# Patient Record
Sex: Female | Born: 1940 | Race: White | Hispanic: No | Smoking: Former smoker
Health system: Southern US, Community
[De-identification: ages and names within clinical notes are randomized; demographics above are authoritative.]

## PROBLEM LIST (undated history)

## (undated) DIAGNOSIS — E785 Hyperlipidemia, unspecified: Secondary | ICD-10-CM

## (undated) DIAGNOSIS — E079 Disorder of thyroid, unspecified: Secondary | ICD-10-CM

## (undated) DIAGNOSIS — F419 Anxiety disorder, unspecified: Secondary | ICD-10-CM

## (undated) DIAGNOSIS — F0390 Unspecified dementia without behavioral disturbance: Secondary | ICD-10-CM

## (undated) DIAGNOSIS — I10 Essential (primary) hypertension: Secondary | ICD-10-CM

## (undated) HISTORY — DX: Anxiety disorder, unspecified: F41.9

## (undated) HISTORY — PX: EYE SURGERY: SHX253

## (undated) HISTORY — DX: Disorder of thyroid, unspecified: E07.9

## (undated) HISTORY — DX: Hyperlipidemia, unspecified: E78.5

## (undated) HISTORY — DX: Essential (primary) hypertension: I10

## (undated) HISTORY — DX: Unspecified dementia, unspecified severity, without behavioral disturbance, psychotic disturbance, mood disturbance, and anxiety: F03.90

---

## 2013-01-05 ENCOUNTER — Encounter: Payer: Self-pay | Admitting: Physician Assistant

## 2013-01-05 ENCOUNTER — Ambulatory Visit (INDEPENDENT_AMBULATORY_CARE_PROVIDER_SITE_OTHER): Payer: Medicare Other | Admitting: Physician Assistant

## 2013-01-05 VITALS — BP 90/60 | HR 60 | Temp 97.0°F | Resp 16 | Ht 58.25 in | Wt 120.0 lb

## 2013-01-05 DIAGNOSIS — F0390 Unspecified dementia without behavioral disturbance: Secondary | ICD-10-CM

## 2013-01-05 DIAGNOSIS — F411 Generalized anxiety disorder: Secondary | ICD-10-CM

## 2013-01-05 DIAGNOSIS — F419 Anxiety disorder, unspecified: Secondary | ICD-10-CM

## 2013-01-05 DIAGNOSIS — E785 Hyperlipidemia, unspecified: Secondary | ICD-10-CM

## 2013-01-05 DIAGNOSIS — I10 Essential (primary) hypertension: Secondary | ICD-10-CM

## 2013-01-05 DIAGNOSIS — R5383 Other fatigue: Secondary | ICD-10-CM

## 2013-01-05 DIAGNOSIS — R5381 Other malaise: Secondary | ICD-10-CM

## 2013-01-05 DIAGNOSIS — E079 Disorder of thyroid, unspecified: Secondary | ICD-10-CM

## 2013-01-05 LAB — TSH: TSH: 3.471 u[IU]/mL (ref 0.350–4.500)

## 2013-01-05 LAB — CBC WITH DIFFERENTIAL/PLATELET
Basophils Relative: 0 % (ref 0–1)
Eosinophils Absolute: 0 10*3/uL (ref 0.0–0.7)
Eosinophils Relative: 1 % (ref 0–5)
Hemoglobin: 14.1 g/dL (ref 12.0–15.0)
MCH: 31.1 pg (ref 26.0–34.0)
MCHC: 33.3 g/dL (ref 30.0–36.0)
MCV: 93.4 fL (ref 78.0–100.0)
Monocytes Absolute: 0.5 10*3/uL (ref 0.1–1.0)
Monocytes Relative: 10 % (ref 3–12)
Neutrophils Relative %: 51 % (ref 43–77)

## 2013-01-05 LAB — COMPLETE METABOLIC PANEL WITH GFR
Alkaline Phosphatase: 65 U/L (ref 39–117)
BUN: 15 mg/dL (ref 6–23)
CO2: 31 mEq/L (ref 19–32)
Creat: 0.95 mg/dL (ref 0.50–1.10)
GFR, Est African American: 68 mL/min
GFR, Est Non African American: 59 mL/min — ABNORMAL LOW
Glucose, Bld: 105 mg/dL — ABNORMAL HIGH (ref 70–99)
Sodium: 142 mEq/L (ref 135–145)
Total Bilirubin: 0.4 mg/dL (ref 0.3–1.2)

## 2013-01-05 NOTE — Progress Notes (Signed)
Patient ID: Tamara Chavez MRN: [id number], DOB: November 27, 1940, 72 y.o. Date of Encounter: @DATE @  Chief Complaint:  Chief Complaint  Patient presents with  . new pt get est  baseline exam  ? alzheimers  want brain scan    dtr report alot of forgetfulness long/short term  ? HTN  dtr concerned about Namends    HPI: 72 y.o. year old female  presents with her daughter for initial evaluation here. Her daughter, Tamara Chavez, is a patient here. Her mother, Ms. Tamara Chavez, was living in [place], [place] until recently. Pt's husband died one month ago-Now she has moved here to live with her daughter, Tamara Chavez, and her family.  The daughter had not been informed of her mom's medical conditions-pt's husband had taken her to her doctors, etc in [place]. Now pt living with daughter and she doesnot know her mom's medical conditions. Wants to make sure she is on appropriate meds. Doesn't know if she needs a brain scan or what. Says her mom doesn't know her own son's birthdate. Says she cannot leave her in the house alone at all. Says yesterday sent her to mailbox and later found her wandering in back yard. Says she is able to "care for herself"-perform ADLs. "Eats good"- Three meals a day.  Daughter concerned she is not on right meds b/c she has seen no improvement despite her being on Aricept and Namenda.   Past Medical History  Diagnosis Date  . Anxiety   . Dementia   . Hypertension   . Hyperlipidemia   . Thyroid disease     hypothyroid     Home Meds: No current outpatient prescriptions on file prior to visit.   No current facility-administered medications on file prior to visit.    Allergies: No Known Allergies  History   Social History  . Marital Status: Widowed    Spouse Name: N/A    Number of Children: N/A  . Years of Education: N/A   Occupational History  . Not on file.   Social History Main Topics  . Smoking status: Former Smoker -- 0.50 packs/day for 6 years    Quit  date: 08/07/1993  . Smokeless tobacco: Never Used  . Alcohol Use: No  . Drug Use: No  . Sexually Active: Not on file   Other Topics Concern  . Not on file   Social History Narrative  . No narrative on file    Family History  Problem Relation Age of Onset  . Alcohol abuse Mother   . Alcohol abuse Father      Review of Systems: Constitutional: negative for chills, fever, night sweats, weight changes, or fatigue  HEENT: negative for vision changes, hearing loss, congestion, rhinorrhea, ST, epistaxis, or sinus pressure Cardiovascular: negative for chest pain or palpitations. No new/increased shortness of breath or dyspnea on exertion Respiratory: negative for hemoptysis, wheezing, shortness of breath, or cough Abdominal: negative for abdominal pain, nausea, vomiting, diarrhea, or constipation Dermatological: negative for rash or concerning skin lesions Neurologic: negative for headache, dizziness, or syncope All other systems reviewed and are otherwise negative with the exception to those above and in the HPI.   Physical Exam: Blood pressure 90/60, pulse 60, temperature 97 F (36.1 C), temperature source Oral, resp. rate 16, height 4' 10.25" (1.48 m), weight 120 lb (54.432 kg)., Body mass index is 24.85 kg/(m^2). General: Well developed, well nourished,WF. Appears in no acute distress. Head: Normocephalic, atraumatic, eyes without discharge, sclera non-icteric, nares are without discharge. Bilateral auditory canals  clear, TM's are without perforation, pearly grey and translucent with reflective cone of light bilaterally. Oral cavity moist, posterior pharynx without exudate, erythema, peritonsillar abscess, or post nasal drip.  Neck: Supple. No thyromegaly. Full ROM. No lymphadenopathy. Lungs: Clear bilaterally to auscultation without wheezes, rales, or rhonchi. Breathing is unlabored. Heart: RRR with S1 S2. No murmurs, rubs, or gallops. Abdomen: Soft, non-tender, non-distended with  normoactive bowel sounds. No hepatomegaly. No rebound/guarding. No obvious abdominal masses. Musculoskeletal:  Strength and tone normal for age. Extremities/Skin: Warm and dry. No clubbing or cyanosis. No edema. No rashes or suspicious lesions. Neuro: Alert. Moves all extremities spontaneously. Gait is normal. CNII-XII grossly in tact. Psych:  Responds to questions appropriately with a normal affect.     ASSESSMENT AND PLAN:  72 y.o. year old female with  1. Dementia She was seeing a Insurance account manager in [place]. Pt and daughter would like to f/u with a neurologist here. I will refer.  I discussed the natural progression of alzheimers and discussed expectation of Alzheimers meds with pt and duaghter.  Will continue current meds of Namenda and Aricept now. - CBC with Differential - COMPLETE METABOLIC PANEL WITH GFR - Ambulatory referral to Neurology  2. Hypertension At goal. Slightly low today but was normal at LOV with neur -per their OV note. Pt denies any lightheadedness or presyncope. Cont current med. Check lab. - COMPLETE METABOLIC PANEL WITH GFR  3. Thyroid disease On low dose med. Check TSH. - TSH   4. Hyperlipidemia This has been monitored routinely by prior neurologist. Was at goal without medication. Pt not fasting now so will not recheck now.  - COMPLETE METABOLIC PANEL WITH GFR  6. Other malaise and fatigue  7. Screening Mammogram: Last was 04/2012-Negative - CBC with Differential - TSH   Signed, [address] [place], [place], [facility] 01/05/2013 1:14 PM

## 2013-01-12 ENCOUNTER — Ambulatory Visit (INDEPENDENT_AMBULATORY_CARE_PROVIDER_SITE_OTHER): Payer: Medicare Other | Admitting: Physician Assistant

## 2013-01-12 ENCOUNTER — Encounter: Payer: Self-pay | Admitting: Physician Assistant

## 2013-01-12 VITALS — BP 112/80 | HR 68 | Temp 97.4°F | Resp 14 | Ht <= 58 in | Wt 121.0 lb

## 2013-01-12 DIAGNOSIS — N390 Urinary tract infection, site not specified: Secondary | ICD-10-CM

## 2013-01-12 DIAGNOSIS — N760 Acute vaginitis: Secondary | ICD-10-CM

## 2013-01-12 DIAGNOSIS — B9689 Other specified bacterial agents as the cause of diseases classified elsewhere: Secondary | ICD-10-CM

## 2013-01-12 DIAGNOSIS — A499 Bacterial infection, unspecified: Secondary | ICD-10-CM

## 2013-01-12 LAB — URINALYSIS, ROUTINE W REFLEX MICROSCOPIC
Ketones, ur: NEGATIVE mg/dL
Nitrite: NEGATIVE
Specific Gravity, Urine: <1.005 — ABNORMAL LOW (ref 1.005–1.030)
Urobilinogen, UA: 0.2 mg/dL (ref 0.0–1.0)

## 2013-01-12 LAB — URINALYSIS, MICROSCOPIC ONLY
Bacteria, UA: NONE SEEN
Crystals: NONE SEEN

## 2013-01-12 LAB — WET PREP FOR TRICH, YEAST, CLUE

## 2013-01-12 MED ORDER — METRONIDAZOLE 500 MG PO TABS: 500.0000 mg | ORAL_TABLET | Freq: Two times a day (BID) | ORAL | Status: DC

## 2013-01-12 NOTE — Progress Notes (Signed)
Patient ID: Tamara Chavez MRN: [id number], DOB: 1940/10/14, 72 y.o. Date of Encounter: 01/12/2013, 1:33 PM    Chief Complaint:  Chief Complaint  Patient presents with  . Urinary Tract Infection     HPI: 72 y.o. year old female here with her daughter. She has been experiencing dysuria past couple days. No abominal/suprapubic pain or pressure. No urinary frequency or urgency. No fever or chills. No vaginal itching or discharge.   Home Meds: See attached medication section for any medications that were entered at today's visit. The computer does not put those onto this list.The following list is a list of meds entered prior to today's visit.   Current Outpatient Prescriptions on File Prior to Visit  Medication Sig Dispense Refill  . atenolol (TENORMIN) 25 MG tablet Take 25 mg by mouth every evening.      . donepezil (ARICEPT) 10 MG tablet Take 10 mg by mouth at bedtime.      . folic acid (FOLVITE) 400 MCG tablet Take 400 mcg by mouth daily.      Marland Kitchen levothyroxine (SYNTHROID, LEVOTHROID) 25 MCG tablet Take 25 mcg by mouth daily before breakfast.      . memantine (NAMENDA) 10 MG tablet Take 10 mg by mouth every evening.      . Memantine HCl ER (NAMENDA XR) 28 MG CP24 Take 1 capsule by mouth every morning.      . vitamin B-12 (CYANOCOBALAMIN) 1000 MCG tablet Take 1,000 mcg by mouth daily.      . vitamin E (VITAMIN E) 400 UNIT capsule Take 400 Units by mouth daily.       No current facility-administered medications on file prior to visit.    Allergies: No Known Allergies    Review of Systems: See HPI for pertinent ROS. All other ROS negative.    Physical Exam: Blood pressure 112/80, pulse 68, temperature 97.4 F (36.3 C), temperature source Oral, resp. rate 14, height 4\' 10"  (1.473 m), weight 121 lb (54.885 kg)., Body mass index is 25.3 kg/(m^2). General:WNWD WF.  Appears in no acute distress. Lungs: Clear bilaterally to auscultation without wheezes, rales, or rhonchi. Breathing is  unlabored. Heart: Regular rhythm. No murmurs, rubs, or gallops. Abdomen: Soft, non-tender, non-distended with normoactive bowel sounds. No hepatomegaly. No rebound/guarding. No obvious abdominal masses. Msk:  Strength and tone normal for age. Extremities/Skin: Warm and dry. No clubbing or cyanosis. No edema. No rashes or suspicious lesions. Neuro: Alert and oriented X 3. Moves all extremities spontaneously. Gait is normal. CNII-XII grossly in tact. Psych:  Responds to questions appropriately with a normal affect. Pelvic: External genitalia norma with no erythema, edema, excoriation. No significant discharge. I swabbed vaginal area but did not use speculum.    Results for orders placed in visit on 01/12/13  WET PREP FOR TRICH, YEAST, CLUE      Result Value Range   Yeast Wet Prep HPF POC NONE SEEN  NONE SEEN   Trich, Wet Prep NONE SEEN  NONE SEEN   Clue Cells Wet Prep HPF POC FEW (*) NONE SEEN   WBC, Wet Prep HPF POC FEW  NONE SEEN  URINALYSIS, ROUTINE W REFLEX MICROSCOPIC      Result Value Range   Color, Urine YELLOW  YELLOW   APPearance CLEAR  CLEAR   Specific Gravity, Urine <1.005 (*) 1.005 - 1.030   pH 5.5  5.0 - 8.0   Glucose, UA NEG  NEG mg/dL   Bilirubin Urine NEG  NEG   Ketones, ur  NEG  NEG mg/dL   Hgb urine dipstick TRACE (*) NEG   Protein, ur NEG  NEG mg/dL   Urobilinogen, UA 0.2  0.0 - 1.0 mg/dL   Nitrite NEG  NEG   Leukocytes, UA NEG  NEG  URINALYSIS, MICROSCOPIC ONLY      Result Value Range   Squamous Epithelial / LPF RARE  RARE   Crystals NONE SEEN  NONE SEEN   Casts NONE SEEN  NONE SEEN   WBC, UA NONE SEEN  <3 WBC/hpf   RBC / HPF NONE SEEN  <3 RBC/hpf   Bacteria, UA NONE SEEN  RARE      ASSESSMENT AND PLAN:  73 y.o. year old female with  1. Bacterial vaginosis - metroNIDAZOLE (FLAGYL) 500 MG tablet; Take 1 tablet (500 mg total) by mouth 2 (two) times daily.  Dispense: 14 tablet; Refill: 0  2. Dysuria - Urinalysis, Routine w reflex microscopic - Wet Prep  for Trick, Yeast, Clue  3. Vaginitis and vulvovaginitis - Wet Prep for Trick, Yeast, Clue  Take Flagyl as above. If symptoms do not resolve, f/u  Signed, [address] [place], [place], [facility] 01/12/2013 1:33 PM

## 2013-01-30 ENCOUNTER — Encounter: Payer: Self-pay | Admitting: Diagnostic Neuroimaging

## 2013-01-30 ENCOUNTER — Ambulatory Visit (INDEPENDENT_AMBULATORY_CARE_PROVIDER_SITE_OTHER): Payer: Medicare Other | Admitting: Diagnostic Neuroimaging

## 2013-01-30 VITALS — BP 110/72 | HR 59 | Temp 98.0°F | Ht 60.0 in | Wt 120.0 lb

## 2013-01-30 DIAGNOSIS — F0390 Unspecified dementia without behavioral disturbance: Secondary | ICD-10-CM

## 2013-01-30 DIAGNOSIS — R569 Unspecified convulsions: Secondary | ICD-10-CM | POA: Insufficient documentation

## 2013-01-30 DIAGNOSIS — I6930 Unspecified sequelae of cerebral infarction: Secondary | ICD-10-CM | POA: Insufficient documentation

## 2013-01-30 NOTE — Patient Instructions (Signed)
Return for follow up in 6 months.  Call with concerns or questions.

## 2013-01-30 NOTE — Progress Notes (Signed)
[facility]  PATIENT: Tamara Chavez DOB: [birth year]  REFERRING CLINICIAN: Dixon HISTORY FROM: patient, daughter, son REASON FOR VISIT: moved from [facility], consult for memory loss.   HISTORICAL  CHIEF COMPLAINT:  Chief Complaint  Patient presents with  . Memory Loss    NP..#7    HISTORY OF PRESENT ILLNESS:   72 year old Caucasian female with history of memory loss.  She lived previously in [place], [place] with her husband, who passed away approximately 1 month ago.  Son and daughter report they noticed memory loss as long as 5 years ago.  They state their father did not share their mother's medical problems with them.  They have requested her records from previous Neurologist in [facility].  They state she was driving previously  In [facility], but recently missed exit and forgot how to get home. Patient states she has lost interest in doing quilting, but she does stay active.  They state her mood is consistently good.  She is able to perform ADLs on her own.  Denies mood swings, change in appetite, incontinence.  Tolerating medication well.  REVIEW OF SYSTEMS: Full 14 system review of systems performed and notable only for memory loss.  ALLERGIES: No Known Allergies  HOME MEDICATIONS: Outpatient Prescriptions Prior to Visit  Medication Sig Dispense Refill  . atenolol (TENORMIN) 25 MG tablet Take 25 mg by mouth every evening.      . donepezil (ARICEPT) 10 MG tablet Take 10 mg by mouth at bedtime.      . folic acid (FOLVITE) 400 MCG tablet Take 400 mcg by mouth daily.      Marland Kitchen levothyroxine (SYNTHROID, LEVOTHROID) 25 MCG tablet Take 25 mcg by mouth daily before breakfast.      . Memantine HCl ER (NAMENDA XR) 28 MG CP24 Take 1 capsule by mouth every morning.      . metroNIDAZOLE (FLAGYL) 500 MG tablet Take 1 tablet (500 mg total) by mouth 2 (two) times daily.  14 tablet  0  . vitamin B-12 (CYANOCOBALAMIN) 1000 MCG tablet Take 1,000 mcg by mouth daily.      . vitamin E (VITAMIN E) 400  UNIT capsule Take 400 Units by mouth daily.      . memantine (NAMENDA) 10 MG tablet Take 10 mg by mouth every evening.       No facility-administered medications prior to visit.    PAST MEDICAL HISTORY: Past Medical History  Diagnosis Date  . Anxiety   . Dementia   . Hypertension   . Hyperlipidemia   . Thyroid disease     hypothyroid    PAST SURGICAL HISTORY: Past Surgical History  Procedure Laterality Date  . Eye surgery      cataracts, eye lift    FAMILY HISTORY: Family History  Problem Relation Age of Onset  . Alcohol abuse Mother   . Alcohol abuse Father     SOCIAL HISTORY:  History   Social History  . Marital Status: Widowed    Spouse Name: N/A    Number of Children: N/A  . Years of Education: N/A   Occupational History  . Not on file.   Social History Main Topics  . Smoking status: Former Smoker -- 0.50 packs/day for 6 years    Quit date: 08/07/1993  . Smokeless tobacco: Never Used  . Alcohol Use: No  . Drug Use: No  . Sexually Active: Not on file   Other Topics Concern  . Not on file   Social History  Narrative  . No narrative on file     PHYSICAL EXAM  Filed Vitals:   01/30/13 1202  BP: 110/72  Pulse: 59  Temp: 98 F (36.7 C)  TempSrc: Oral  Height: 5' (1.524 m)  Weight: 120 lb (54.432 kg)    Not recorded    Body mass index is 23.44 kg/(m^2).  GENERAL EXAM: Patient is in no distress, well developed, well groomed. Pleasant.  CARDIOVASCULAR: Regular rate and rhythm, no murmurs, no carotid bruits  NEUROLOGIC: MENTAL STATUS: awake, alert, language fluent, comprehension intact, naming intact. MMSE 14/30, indicates moderate dementia with deficits in recall, attention and calculation, writing and drawing.  AFT 6, clock drawing 1/4. MOTOR APRAXIA. POOR INSIGHT. PLEASANT AND POLITE. CRANIAL NERVE: no papilledema on fundoscopic exam, pupils equal and reactive to light, visual fields full to confrontation, extraocular muscles intact, no  nystagmus, facial sensation and strength symmetric, uvula midline, shoulder shrug symmetric, tongue midline. MOTOR: normal bulk and tone, full strength in the BUE, BLE SENSORY: normal and symmetric to light touch, pinprick, temperature, vibration COORDINATION: finger-nose-finger, fine finger movements normal REFLEXES: deep tendon reflexes present and symmetric GAIT/STATION: narrow based gait; able to walk on toes, heels and tandem; romberg is negative   DIAGNOSTIC DATA (LABS, IMAGING, TESTING) - I reviewed patient records, labs, notes, testing and imaging myself where available.  Lab Results  Component Value Date   WBC 4.9 01/05/2013   HGB 14.1 01/05/2013   HCT 42.3 01/05/2013   MCV 93.4 01/05/2013   PLT 264 01/05/2013      Component Value Date/Time   NA 142 01/05/2013 1005   K 4.9 01/05/2013 1005   CL 102 01/05/2013 1005   CO2 31 01/05/2013 1005   GLUCOSE 105* 01/05/2013 1005   BUN 15 01/05/2013 1005   CREATININE 0.95 01/05/2013 1005   CALCIUM 10.0 01/05/2013 1005   PROT 7.5 01/05/2013 1005   ALBUMIN 4.3 01/05/2013 1005   AST 22 01/05/2013 1005   ALT 19 01/05/2013 1005   ALKPHOS 65 01/05/2013 1005   BILITOT 0.4 01/05/2013 1005   No results found for this basename: CHOL, HDL, LDLCALC, LDLDIRECT, TRIG, CHOLHDL   No results found for this basename: HGBA1C   No results found for this basename: VITAMINB12   Lab Results  Component Value Date   TSH 3.471 01/05/2013      ASSESSMENT AND PLAN  72 y.o. year old female  has a past medical history of Anxiety; Dementia; Hypertension; Hyperlipidemia; and Thyroid disease. here with memory loss, likely Alzheimer's Dementia.  PLAN: 1. Continue donepezil and namenda 2. Local resources for aging seniors given to family   Suanne Marker, MD 01/30/2013, 12:26 PM Certified in Neurology, Neurophysiology and Neuroimaging  [facility] [address], [address] [place], [place] [address] [phone]

## 2013-03-05 ENCOUNTER — Telehealth: Payer: Self-pay | Admitting: Physician Assistant

## 2013-03-05 NOTE — Telephone Encounter (Signed)
Approved. I reviewed LOV note and her med list. Print Rxes for; Aricept 10mg  one po QHS Levothyroxine one po QD Each for #90 / 3 Refills is ok.

## 2013-03-06 ENCOUNTER — Other Ambulatory Visit: Payer: Self-pay | Admitting: Family Medicine

## 2013-03-06 MED ORDER — MEMANTINE HCL ER 28 MG PO CP24: 1.0000 | ORAL_CAPSULE | Freq: Every morning | ORAL | Status: DC

## 2013-03-06 MED ORDER — DONEPEZIL HCL 10 MG PO TABS: 10.0000 mg | ORAL_TABLET | Freq: Every day | ORAL | Status: DC

## 2013-03-06 MED ORDER — LEVOTHYROXINE SODIUM 25 MCG PO TABS: 25.0000 ug | ORAL_TABLET | Freq: Every day | ORAL | Status: DC

## 2013-03-06 NOTE — Telephone Encounter (Signed)
Done

## 2013-03-06 NOTE — Telephone Encounter (Signed)
Pts daughter called back and said that this is not for mail order, but for local pharmacy ([facility]). Can we please send there?

## 2013-03-06 NOTE — Telephone Encounter (Signed)
Rx Refilled  

## 2013-05-01 ENCOUNTER — Telehealth: Payer: Self-pay | Admitting: Physician Assistant

## 2013-05-01 NOTE — Telephone Encounter (Signed)
Approved.  I'm not sure why med not showing up on her list, but I know who this lady is--she is mother of our patients the [place]-she did live in [place] but is now living with them b/c she has dementia.  Med not being abused, I am sure.  May give # 60 plus 2 additional refills.

## 2013-05-01 NOTE — Telephone Encounter (Signed)
Lorazepam 0.5 mg tab 1 BID #60 last rf 11/30/12

## 2013-05-01 NOTE — Telephone Encounter (Signed)
Need approval for controlled medication. 

## 2013-05-02 MED ORDER — LORAZEPAM 0.5 MG PO TABS: 0.5000 mg | ORAL_TABLET | Freq: Two times a day (BID) | ORAL | Status: AC | PRN

## 2013-05-02 NOTE — Telephone Encounter (Signed)
rx called in

## 2013-05-04 ENCOUNTER — Telehealth: Payer: Self-pay | Admitting: Diagnostic Neuroimaging

## 2013-05-04 NOTE — Telephone Encounter (Signed)
I returned patient's daughter's call. Patient has been with daughter for 4 months. Patient has refused to get out of the car to go to family resources. Daughter is concerned and needs support in the community. Please advise.   I will let her know that I will be in on Tuesday and will find out what resources are available to her for transportation, family support, other community activities and support. I will call her back then.

## 2013-05-16 ENCOUNTER — Telehealth: Payer: Self-pay | Admitting: Physician Assistant

## 2013-05-16 MED ORDER — MEMANTINE HCL 10 MG PO TABS: 10.0000 mg | ORAL_TABLET | Freq: Two times a day (BID) | ORAL | Status: DC

## 2013-05-16 NOTE — Telephone Encounter (Signed)
Namenda XR on manufacturer backorder since May.  Switch back to plain Namenda 10 mg BID  #60 + 2 refills for now per provider.   Pharmacy to let us know when the XR is available again so we can switch patient back to the once a day Namenda.

## 2013-05-16 NOTE — Telephone Encounter (Signed)
[facility] and see  what the issue is. My understanding was that the BID pill was going away and just the XR was going to be available. ?? Is it just that the [facility] is [facility] or has this dose stopped altogether????

## 2013-06-14 ENCOUNTER — Encounter: Payer: Self-pay | Admitting: Physician Assistant

## 2013-06-14 ENCOUNTER — Ambulatory Visit (INDEPENDENT_AMBULATORY_CARE_PROVIDER_SITE_OTHER): Payer: Medicare Other | Admitting: Physician Assistant

## 2013-06-14 VITALS — BP 108/74 | HR 60 | Temp 97.9°F | Resp 18 | Wt 117.0 lb

## 2013-06-14 DIAGNOSIS — R300 Dysuria: Secondary | ICD-10-CM

## 2013-06-14 DIAGNOSIS — F0390 Unspecified dementia without behavioral disturbance: Secondary | ICD-10-CM

## 2013-06-14 DIAGNOSIS — F419 Anxiety disorder, unspecified: Secondary | ICD-10-CM

## 2013-06-14 DIAGNOSIS — F411 Generalized anxiety disorder: Secondary | ICD-10-CM

## 2013-06-14 LAB — URINALYSIS, ROUTINE W REFLEX MICROSCOPIC
Hgb urine dipstick: NEGATIVE
Protein, ur: NEGATIVE mg/dL
Urobilinogen, UA: 0.2 mg/dL (ref 0.0–1.0)

## 2013-06-14 LAB — URINALYSIS, MICROSCOPIC ONLY

## 2013-06-14 NOTE — Progress Notes (Signed)
Patient ID: Tamara Chavez MRN: [id number], DOB: September 01, 1941, 72 y.o. Date of Encounter: 06/14/2013, 10:02 AM    Chief Complaint:  Chief Complaint  Patient presents with  . burning with urination     HPI: 72 y.o. year old white female who has recently been diagnosed with dementia and has been treated for this at neurology- is here with her daughter who she is now living with.  Daughter reports that she is constantly trying to get the patient to drink of water and fluids. Says patient will not drink  because she is afraid she will then need to urinate and not be able to get to the bathroom in time. Daughter says that she does this even when there in the house where she has two bathrooms easily available to her access. As well any time they are going to do errands etc. in the car, the daughter makes sure to have her urinate in a house prior to leaving and make sure she knows where the bathrooms are located inside of the stores et [employer]. She has never had any urinary incontinence or leakage at all. Her underwear stay dry. Daughter says that she sleeps for 9 hours with no urination. Daughter has a very difficult time managing the patient's dementia. Appologizes for bringing her in today  but did not know whether she truly had a problem such as an infection.     Home Meds: See attached medication section for any medications that were entered at today's visit. The computer does not put those onto this list.The following list is a list of meds entered prior to today's visit.   Current Outpatient Prescriptions on File Prior to Visit  Medication Sig Dispense Refill  . atenolol (TENORMIN) 25 MG tablet Take 25 mg by mouth every evening.      . donepezil (ARICEPT) 10 MG tablet Take 1 tablet (10 mg total) by mouth at bedtime.  30 tablet  5  . folic acid (FOLVITE) 400 MCG tablet Take 400 mcg by mouth daily.      Marland Kitchen levothyroxine (SYNTHROID, LEVOTHROID) 25 MCG tablet Take 1 tablet (25 mcg total) by  mouth daily before breakfast.  30 tablet  5  . LORazepam (ATIVAN) 0.5 MG tablet Take 1 tablet (0.5 mg total) by mouth 2 (two) times daily as needed for anxiety.  60 tablet  0  . memantine (NAMENDA) 10 MG tablet Take 1 tablet (10 mg total) by mouth 2 (two) times daily.  60 tablet  2  . vitamin B-12 (CYANOCOBALAMIN) 1000 MCG tablet Take 1,000 mcg by mouth daily.      . vitamin E (VITAMIN E) 400 UNIT capsule Take 400 Units by mouth daily.      . Memantine HCl ER (NAMENDA XR) 28 MG CP24 Take 28 mg by mouth every morning.  30 capsule  3   No current facility-administered medications on file prior to visit.    Allergies: No Known Allergies    Review of Systems: See HPI for pertinent ROS. All other ROS negative.    Physical Exam: Blood pressure 108/74, pulse 60, temperature 97.9 F (36.6 C), temperature source Oral, resp. rate 18, weight 117 lb (53.071 kg)., Body mass index is 22.85 kg/(m^2). General: Well-nourished well-developed white female. Appears in no acute distress. Lungs: Clear bilaterally to auscultation without wheezes, rales, or rhonchi. Breathing is unlabored. Heart: Regular rhythm. No murmurs, rubs, or gallops. Abdomen: Soft, non-tender, non-distended with normoactive bowel sounds. No hepatomegaly. No rebound/guarding. No obvious abdominal masses.  Msk:  Strength and tone normal for age. Extremities/Skin: Warm and dry. No clubbing or cyanosis. No edema. No rashes or suspicious lesions. Neuro:  Moves all extremities spontaneously. Gait is normal. CNII-XII grossly in tact. Psych:  Responds to questions appropriately with a normal affect.   Results for orders placed in visit on 06/14/13  URINALYSIS, ROUTINE W REFLEX MICROSCOPIC      Result Value Range   Color, Urine YELLOW  YELLOW   APPearance CLEAR  CLEAR   Specific Gravity, Urine <1.005 (*) 1.005 - 1.030   pH 6.0  5.0 - 8.0   Glucose, UA NEG  NEG mg/dL   Bilirubin Urine NEG  NEG   Ketones, ur NEG  NEG mg/dL   Hgb urine  dipstick NEG  NEG   Protein, ur NEG  NEG mg/dL   Urobilinogen, UA 0.2  0.0 - 1.0 mg/dL   Nitrite NEG  NEG   Leukocytes, UA TRACE (*) NEG  URINALYSIS, MICROSCOPIC ONLY      Result Value Range   Squamous Epithelial / LPF RARE  RARE   Crystals NONE SEEN  NONE SEEN   Casts NONE SEEN  NONE SEEN   WBC, UA 0-2  <3 WBC/hpf   RBC / HPF 0-2  <3 RBC/hpf   Bacteria, UA RARE  RARE      ASSESSMENT AND PLAN:  72 y.o. year old female with  1. Burning with urination - Urinalysis, Routine w reflex microscopic 2. Anxiety 3. Dementia  Reassured patient and daughter they urinalysis did not show infection. Discussed with them over active bladder which includes symptoms of urgency. Discussed with them that medications are available to help with this. However daughter thinks that this is more of just a fear in the patient's mind. Does not think that it is a true urgency where she is going to have urinary incontinence if she does not get to the bathroom urgently. Additionally, she really does not want to add more medications to the current medications she is taking. She does have hired help caring for the patient in the home 2 days a week. Her dementia is being managed by neurology.   Signed, [address] [place], [place], [facility] 06/14/2013 10:02 AM

## 2013-07-03 ENCOUNTER — Telehealth: Payer: Self-pay | Admitting: Physician Assistant

## 2013-07-03 MED ORDER — ATENOLOL 25 MG PO TABS: 25.0000 mg | ORAL_TABLET | Freq: Every evening | ORAL | Status: AC

## 2013-07-03 NOTE — Telephone Encounter (Signed)
Needs new Rx for Atenolol

## 2013-07-16 ENCOUNTER — Emergency Department (HOSPITAL_COMMUNITY)
Admission: EM | Admit: 2013-07-16 | Discharge: 2013-07-16 | Disposition: A | Discharge status: Home / Self Care | Payer: Medicare Other | Attending: Emergency Medicine | Admitting: Emergency Medicine

## 2013-07-16 ENCOUNTER — Encounter (HOSPITAL_COMMUNITY): Payer: Self-pay | Admitting: Emergency Medicine

## 2013-07-16 ENCOUNTER — Emergency Department (HOSPITAL_COMMUNITY): Payer: Medicare Other

## 2013-07-16 DIAGNOSIS — Y939 Activity, unspecified: Secondary | ICD-10-CM | POA: Insufficient documentation

## 2013-07-16 DIAGNOSIS — W19XXXA Unspecified fall, initial encounter: Secondary | ICD-10-CM

## 2013-07-16 DIAGNOSIS — I609 Nontraumatic subarachnoid hemorrhage, unspecified: Secondary | ICD-10-CM

## 2013-07-16 DIAGNOSIS — S066X0A Traumatic subarachnoid hemorrhage without loss of consciousness, initial encounter: Secondary | ICD-10-CM | POA: Insufficient documentation

## 2013-07-16 DIAGNOSIS — M25569 Pain in unspecified knee: Secondary | ICD-10-CM | POA: Insufficient documentation

## 2013-07-16 DIAGNOSIS — S069X9A Unspecified intracranial injury with loss of consciousness of unspecified duration, initial encounter: Secondary | ICD-10-CM

## 2013-07-16 DIAGNOSIS — S0003XA Contusion of scalp, initial encounter: Secondary | ICD-10-CM | POA: Insufficient documentation

## 2013-07-16 DIAGNOSIS — IMO0002 Reserved for concepts with insufficient information to code with codable children: Secondary | ICD-10-CM | POA: Insufficient documentation

## 2013-07-16 DIAGNOSIS — F0390 Unspecified dementia without behavioral disturbance: Secondary | ICD-10-CM | POA: Insufficient documentation

## 2013-07-16 DIAGNOSIS — S0190XA Unspecified open wound of unspecified part of head, initial encounter: Secondary | ICD-10-CM | POA: Insufficient documentation

## 2013-07-16 DIAGNOSIS — Z79899 Other long term (current) drug therapy: Secondary | ICD-10-CM | POA: Insufficient documentation

## 2013-07-16 DIAGNOSIS — T148XXA Other injury of unspecified body region, initial encounter: Secondary | ICD-10-CM

## 2013-07-16 DIAGNOSIS — W108XXA Fall (on) (from) other stairs and steps, initial encounter: Secondary | ICD-10-CM | POA: Insufficient documentation

## 2013-07-16 DIAGNOSIS — M546 Pain in thoracic spine: Secondary | ICD-10-CM | POA: Insufficient documentation

## 2013-07-16 DIAGNOSIS — F411 Generalized anxiety disorder: Secondary | ICD-10-CM | POA: Insufficient documentation

## 2013-07-16 DIAGNOSIS — Y929 Unspecified place or not applicable: Secondary | ICD-10-CM | POA: Insufficient documentation

## 2013-07-16 DIAGNOSIS — E079 Disorder of thyroid, unspecified: Secondary | ICD-10-CM | POA: Insufficient documentation

## 2013-07-16 DIAGNOSIS — I10 Essential (primary) hypertension: Secondary | ICD-10-CM | POA: Insufficient documentation

## 2013-07-16 DIAGNOSIS — Z87891 Personal history of nicotine dependence: Secondary | ICD-10-CM | POA: Insufficient documentation

## 2013-07-16 DIAGNOSIS — E785 Hyperlipidemia, unspecified: Secondary | ICD-10-CM | POA: Insufficient documentation

## 2013-07-16 DIAGNOSIS — S069XAA Unspecified intracranial injury with loss of consciousness status unknown, initial encounter: Secondary | ICD-10-CM

## 2013-07-16 DIAGNOSIS — R51 Headache: Secondary | ICD-10-CM | POA: Insufficient documentation

## 2013-07-16 DIAGNOSIS — S065X9A Traumatic subdural hemorrhage with loss of consciousness of unspecified duration, initial encounter: Secondary | ICD-10-CM

## 2013-07-16 NOTE — ED Notes (Signed)
Pt. At baseline at discharge. Ambulatory. No neuro deficits noted. Daughter at bedside, verbalized understanding of discharge instructions.

## 2013-07-16 NOTE — ED Notes (Signed)
Dr Wyatt at bedside.  

## 2013-07-16 NOTE — ED Notes (Addendum)
Per EMS, Pt was walking down steps lost balance and fell down 10-12 steps no LOC. Pt c/o of left shoulder and right knee pain. EMS gave 100 mcg of fentanyl which relieved the pain. Pt reports right hip pain, no shortening or rotations pt has active ROM with RLE.

## 2013-07-16 NOTE — Consult Note (Signed)
Reason for Consult:Head bleed after fall Referring Physician: Dr. Willaim Sheng Tamara Chavez is an 72 y.o. female.  HPI: Patient fell down down some stairs after losing her balance.  No LOC.  Awake and alert after fall.  No nausea or vomiting.  CT showed small parafalcine SDH and some mild SAH.    Past Medical History  Diagnosis Date  . Anxiety   . Dementia   . Hypertension   . Hyperlipidemia   . Thyroid disease     hypothyroid    Past Surgical History  Procedure Laterality Date  . Eye surgery      cataracts, eye lift    Family History  Problem Relation Age of Onset  . Alcohol abuse Mother   . Alcohol abuse Father     Social History:  reports that she quit smoking about 19 years ago. She has never used smokeless tobacco. She reports that she does not drink alcohol or use illicit drugs.  Allergies: No Known Allergies  Medications: I have reviewed the patient's current medications.  No results found for this or any previous visit (from the past 48 hour(s)).  Dg Chest 2 View  07/16/2013   CLINICAL DATA:  Back pain after fall.  EXAM: CHEST  2 VIEW  COMPARISON:  None.  FINDINGS: The heart size and mediastinal contours are within normal limits. Both lungs are clear. The visualized skeletal structures are unremarkable.  IMPRESSION: No active cardiopulmonary disease.   Electronically Signed   By: Roque Lias M.D.   On: 07/16/2013 18:41   Dg Shoulder Right  07/16/2013   CLINICAL DATA:  Fall. Right shoulder pain.  EXAM: RIGHT SHOULDER - 2+ VIEW  COMPARISON:  None.  FINDINGS: No acute bony abnormality. Specifically, no fracture, subluxation, or dislocation. Soft tissues are intact.  IMPRESSION: No acute bony abnormality.   Electronically Signed   By: Charlett Nose M.D.   On: 07/16/2013 18:42   Dg Tibia/fibula Right  07/16/2013   CLINICAL DATA:  Right tibia pain after fall.  EXAM: RIGHT TIBIA AND FIBULA - 2 VIEW  COMPARISON:  None.  FINDINGS: There is no evidence of fracture or  other focal bone lesions. Soft tissues are unremarkable.  IMPRESSION: Normal right tibia and fibula.   Electronically Signed   By: Roque Lias M.D.   On: 07/16/2013 18:44   Ct Head Wo Contrast  07/16/2013   CLINICAL DATA:  Cervical spondylosis and degenerative disc disease.  EXAM: CT HEAD WITHOUT CONTRAST  CT CERVICAL SPINE WITHOUT CONTRAST  TECHNIQUE: Multidetector CT imaging of the head and cervical spine was performed following the standard protocol without intravenous contrast. Multiplanar CT image reconstructions of the cervical spine were also generated.  COMPARISON:  None.  FINDINGS: CT HEAD FINDINGS  Subtle thickening of the Fox compatible with small full seen subdural hematoma. Tiny amount of subarachnoid hemorrhage medially in the right frontal lobe on image 22 of series 2. No intraventricular hemorrhage or other intracranial hemorrhage observed. No mass lesion or acute CVA. No midline shift.  CT CERVICAL SPINE FINDINGS  Subtle cortical irregularity of the tip of the right lateral transverse process and potentially of the left lateral transverse process of C1 - suspicion is for nondisplaced fractures although the findings are extremely subtle. Not thought to be an unstable fracture.  No other regions of suspicion for cervical spine fracture. Multilevel posterior osseous ridging and uncinate spurring leading osseous foraminal stenosis on the left at C3-4 and C6-7, and on the right at C5-6  and C6-7 degenerative grade 1 anterolisthesis at C7-T1 due to facet arthropathy. The posterior osseous ridging at C5-6 is likely contributing to mild central stenosis.  IMPRESSION: 1. Small falcine subdural hematoma, with a trace amount of subarachnoid hemorrhage medially along the right frontal lobe. 2. Subtle irregularities of the lateral tips of the transverse processes of C1, potentially representing nondisplaced fractures although very subtle. This is not thought to represent an unstable fracture although is  close to the vertebral arteries. Critical Value/emergent results were called by telephone at the time of interpretation on 07/16/2013 at 6:23 PM to [facility] , who verbally acknowledged these results.   Electronically Signed   By: Herbie Baltimore M.D.   On: 07/16/2013 18:24   Ct Cervical Spine Wo Contrast  07/16/2013   CLINICAL DATA:  Cervical spondylosis and degenerative disc disease.  EXAM: CT HEAD WITHOUT CONTRAST  CT CERVICAL SPINE WITHOUT CONTRAST  TECHNIQUE: Multidetector CT imaging of the head and cervical spine was performed following the standard protocol without intravenous contrast. Multiplanar CT image reconstructions of the cervical spine were also generated.  COMPARISON:  None.  FINDINGS: CT HEAD FINDINGS  Subtle thickening of the Fox compatible with small full seen subdural hematoma. Tiny amount of subarachnoid hemorrhage medially in the right frontal lobe on image 22 of series 2. No intraventricular hemorrhage or other intracranial hemorrhage observed. No mass lesion or acute CVA. No midline shift.  CT CERVICAL SPINE FINDINGS  Subtle cortical irregularity of the tip of the right lateral transverse process and potentially of the left lateral transverse process of C1 - suspicion is for nondisplaced fractures although the findings are extremely subtle. Not thought to be an unstable fracture.  No other regions of suspicion for cervical spine fracture. Multilevel posterior osseous ridging and uncinate spurring leading osseous foraminal stenosis on the left at C3-4 and C6-7, and on the right at C5-6 and C6-7 degenerative grade 1 anterolisthesis at C7-T1 due to facet arthropathy. The posterior osseous ridging at C5-6 is likely contributing to mild central stenosis.  IMPRESSION: 1. Small falcine subdural hematoma, with a trace amount of subarachnoid hemorrhage medially along the right frontal lobe. 2. Subtle irregularities of the lateral tips of the transverse processes of C1, potentially  representing nondisplaced fractures although very subtle. This is not thought to represent an unstable fracture although is close to the vertebral arteries. Critical Value/emergent results were called by telephone at the time of interpretation on 07/16/2013 at 6:23 PM to [facility] , who verbally acknowledged these results.   Electronically Signed   By: Herbie Baltimore M.D.   On: 07/16/2013 18:24    ROS Blood pressure 123/53, pulse 63, temperature 97.8 F (36.6 C), temperature source Oral, resp. rate 18, SpO2 99.00%. Physical Exam  HENT:  Head:    Musculoskeletal:       Right lower leg: She exhibits tenderness and laceration (anterior tebial abrasion).       Legs:   Assessment/Plan: GLF Small SAH/SDH, no anticoagulation GCS score normal.  No nausea or vomiting.  I reviewed the CT scan with the neurosurgeon on call and he and I agree that the patient can go home safely with her family as long as she demonstrates the ability to eat, drink and ambulate without difficulty.  Follow up can be with her PCP and neurologist.  Cherylynn Ridges 07/16/2013, 9:13 PM

## 2013-07-16 NOTE — ED Provider Notes (Signed)
CSN: [id number]     Arrival date & time 07/16/13  1715 History   First MD Initiated Contact with Patient 07/16/13 1729     Chief Complaint  Patient presents with  . Fall   (Consider location/radiation/quality/duration/timing/severity/associated sxs/prior Treatment) Patient is a 72 y.o. female presenting with fall. The history is provided by the patient.  Fall This is a new problem. Associated symptoms include headaches. Pertinent negatives include no chest pain, no abdominal pain and no shortness of breath.   patient lost her balance and fell down around 10 stairs. No loss of conscious. His been awake and pleasant, and at his baseline dementia since then. Complaining of pain in her right knee. She also had her hold her head. Her daughter witnessed the fall. She's not been ambulatory since.  Past Medical History  Diagnosis Date  . Anxiety   . Dementia   . Hypertension   . Hyperlipidemia   . Thyroid disease     hypothyroid   Past Surgical History  Procedure Laterality Date  . Eye surgery      cataracts, eye lift   Family History  Problem Relation Age of Onset  . Alcohol abuse Mother   . Alcohol abuse Father    History  Substance Use Topics  . Smoking status: Former Smoker -- 0.50 packs/day for 6 years    Quit date: 08/07/1993  . Smokeless tobacco: Never Used  . Alcohol Use: No   OB History   Grav Para Term Preterm Abortions TAB SAB Ect Mult Living                 Review of Systems  Constitutional: Negative for fever and fatigue.  HENT: Negative for ear pain.   Respiratory: Negative for cough and shortness of breath.   Cardiovascular: Negative for chest pain.  Gastrointestinal: Negative for abdominal pain.  Genitourinary: Negative for flank pain.  Musculoskeletal: Negative for back pain, gait problem, joint swelling and neck pain.  Skin: Positive for wound. Negative for color change.  Neurological: Positive for headaches.    Allergies  Review of patient's  allergies indicates no known allergies.  Home Medications   Current Outpatient Rx  Name  Route  Sig  Dispense  Refill  . atenolol (TENORMIN) 25 MG tablet   Oral   Take 1 tablet (25 mg total) by mouth every evening.   30 tablet   4   . donepezil (ARICEPT) 10 MG tablet   Oral   Take 1 tablet (10 mg total) by mouth at bedtime.   30 tablet   5   . folic acid (FOLVITE) 400 MCG tablet   Oral   Take 400 mcg by mouth daily.         Marland Kitchen levothyroxine (SYNTHROID, LEVOTHROID) 25 MCG tablet   Oral   Take 1 tablet (25 mcg total) by mouth daily before breakfast.   30 tablet   5   . LORazepam (ATIVAN) 0.5 MG tablet   Oral   Take 1 tablet (0.5 mg total) by mouth 2 (two) times daily as needed for anxiety.   60 tablet   0   . memantine (NAMENDA) 10 MG tablet   Oral   Take 1 tablet (10 mg total) by mouth 2 (two) times daily.   60 tablet   2   . vitamin B-12 (CYANOCOBALAMIN) 1000 MCG tablet   Oral   Take 1,000 mcg by mouth daily.         . vitamin E (VITAMIN  E) 400 UNIT capsule   Oral   Take 400 Units by mouth daily.          BP 117/95  Pulse 67  Temp(Src) 97.6 F (36.4 C) (Oral)  Resp 18  SpO2 100% Physical Exam  Constitutional: She appears well-developed and well-nourished.  HENT:  Head: Normocephalic and atraumatic.  Eyes: Pupils are equal, round, and reactive to light.  Neck: Neck supple.  Cardiovascular: Normal rate and regular rhythm.   Pulmonary/Chest: Effort normal and breath sounds normal.  Abdominal: Soft. Bowel sounds are normal.  Musculoskeletal: She exhibits tenderness.  Abrasion to right proximal tibia anteriorly and another abrasion somewhat more distally anteriorly. Tenderness at both sides. Norvasc intact distally. Leg is stable. Mild tenderness to mid thoracic spine. Tenderness to right shoulder anteriorly with range of motion intact. Neurovascular intact bilateral hands.  Neurological: She is alert.  Skin: Skin is warm.    ED Course   Procedures (including critical care time) Labs Review Labs Reviewed - No data to display Imaging Review No results found.  EKG Interpretation   None       MDM   1. Fall, initial encounter   2. Subdural hematoma   3. Contusion   4. Subarachnoid hemorrhage    Patient with fall. CT shows small subdural and subarachnoid. Also had possible fractures of C1. Seen by trauma surgery in the ED and had discussed with neurosurgery. Will be discharged home no neuro deficits.    Juliet Rude. Rubin Payor, MD 07/19/13 [phone]

## 2013-07-17 ENCOUNTER — Telehealth: Payer: Self-pay | Admitting: Physician Assistant

## 2013-07-17 NOTE — Telephone Encounter (Signed)
I saw the ER note.  Is she having any new neurologic changes?  If no new neurologic changes and she feels ok, does not have to come in.

## 2013-07-17 NOTE — Telephone Encounter (Signed)
Patient was seen at the ED last night due to a fall she had ,had earlier that evening . She had CT, Scan of her brain . No broken bones, she has bruising . But over all right she seems to be fine . Did give her an aleve this morning. She is not complaining of any pain . Just wanted to let you know.  Please [place] know if you need to see her.

## 2013-07-19 NOTE — Telephone Encounter (Signed)
Returned pts call no answer.Marland KitchenLMTRC

## 2013-07-19 NOTE — Telephone Encounter (Signed)
LMTRC

## 2013-07-19 NOTE — Telephone Encounter (Signed)
Pt's dtr aware and pt is doing fine, no problems at all.

## 2013-07-19 NOTE — Telephone Encounter (Signed)
Returning your call. °

## 2013-08-06 ENCOUNTER — Ambulatory Visit (INDEPENDENT_AMBULATORY_CARE_PROVIDER_SITE_OTHER): Payer: Medicare Other | Admitting: Physician Assistant

## 2013-08-06 ENCOUNTER — Encounter: Payer: Self-pay | Admitting: Physician Assistant

## 2013-08-06 VITALS — BP 110/66 | HR 60 | Temp 98.3°F | Resp 18 | Wt 117.0 lb

## 2013-08-06 DIAGNOSIS — E079 Disorder of thyroid, unspecified: Secondary | ICD-10-CM

## 2013-08-06 DIAGNOSIS — Z23 Encounter for immunization: Secondary | ICD-10-CM

## 2013-08-06 DIAGNOSIS — I10 Essential (primary) hypertension: Secondary | ICD-10-CM

## 2013-08-06 DIAGNOSIS — F419 Anxiety disorder, unspecified: Secondary | ICD-10-CM

## 2013-08-06 DIAGNOSIS — F0390 Unspecified dementia without behavioral disturbance: Secondary | ICD-10-CM

## 2013-08-06 DIAGNOSIS — F411 Generalized anxiety disorder: Secondary | ICD-10-CM

## 2013-08-06 DIAGNOSIS — E785 Hyperlipidemia, unspecified: Secondary | ICD-10-CM

## 2013-08-07 NOTE — Progress Notes (Signed)
Patient ID: Klaire Court MRN: [id number], DOB: 06-13-1941, 72 y.o. Date of Encounter: 08/07/2013, 5:27 PM    Chief Complaint:  Chief Complaint  Patient presents with  . rec'd letter needs vaccinations     HPI: 72 y.o. year old white female is here with her daughter. The patient has significant dementia and currently lives with the daughter and her family. They recently received notification that she received some immunizations. This is the purpose of their visit today. They have no complaints today.     Home Meds: See attached medication section for any medications that were entered at today's visit. The computer does not put those onto this list.The following list is a list of meds entered prior to today's visit.   Current Outpatient Prescriptions on File Prior to Visit  Medication Sig Dispense Refill  . atenolol (TENORMIN) 25 MG tablet Take 1 tablet (25 mg total) by mouth every evening.  30 tablet  4  . donepezil (ARICEPT) 10 MG tablet Take 1 tablet (10 mg total) by mouth at bedtime.  30 tablet  5  . folic acid (FOLVITE) 400 MCG tablet Take 400 mcg by mouth daily.      Marland Kitchen levothyroxine (SYNTHROID, LEVOTHROID) 25 MCG tablet Take 1 tablet (25 mcg total) by mouth daily before breakfast.  30 tablet  5  . LORazepam (ATIVAN) 0.5 MG tablet Take 1 tablet (0.5 mg total) by mouth 2 (two) times daily as needed for anxiety.  60 tablet  0  . memantine (NAMENDA) 10 MG tablet Take 1 tablet (10 mg total) by mouth 2 (two) times daily.  60 tablet  2  . vitamin B-12 (CYANOCOBALAMIN) 1000 MCG tablet Take 1,000 mcg by mouth daily.      . vitamin E (VITAMIN E) 400 UNIT capsule Take 400 Units by mouth daily.       No current facility-administered medications on file prior to visit.    Allergies: No Known Allergies    Review of Systems: See HPI for pertinent ROS. All other ROS negative.    Physical Exam: Blood pressure 110/66, pulse 60, temperature 98.3 F (36.8 C), temperature source Oral,  resp. rate 18, weight 117 lb (53.071 kg)., Body mass index is 22.85 kg/(m^2). General:  WNWD WF.Appears in no acute distress. Neck: Supple. No thyromegaly. No lymphadenopathy. Lungs: Clear bilaterally to auscultation without wheezes, rales, or rhonchi. Breathing is unlabored. Heart: Regular rhythm. No murmurs, rubs, or gallops. Msk:  Strength and tone normal for age. Extremities/Skin: Warm and dry. No clubbing or cyanosis. No edema. No rashes or suspicious lesions. Neuro: Alert and oriented X 3. Moves all extremities spontaneously. Gait is normal. CNII-XII grossly in tact. Psych:  Responds to questions appropriately with a normal affect.     ASSESSMENT AND PLAN:  72 y.o. year old female with  1. Immunization due - Pneumococcal polysaccharide vaccine 23-valent greater than or equal to 2yo subcutaneous/IM - Tdap vaccine greater than or equal to 7yo IM  2. Dementia  3. Anxiety  4. Hypertension  5. Hyperlipidemia  6. Thyroid disease  During her visit, I reviewed other preventive treatment.  01/05/13 she had labs which included CBC, CMET, TSH. These were all normal.  Last screening mammogram was 04/2012. Negative.    Daughter states that she and the patient will go home and discuss whether to schedule followup mammogram.  Screening colonoscopy: Patient has significant dementia. She lived in [place] until just recently within the past year moved here to be with the daughter.  They will check records to see whether she has had a colonoscopy in the past or not. Even if colonoscopy is due, she will then need to decide whether they want to pursue screening colonoscopy or not given her age and her dementia.  Regarding immunizations,  Pneumococcal vaccine and Tdap Are given today. Daughter is to contact the insurance company to find out coverage of Zostavax. She will need to wait at least 30 days after receiving today's immunizations prior to getting the Zostavax, if she does decide to proceed  with this plan she knows the cost.    Signed, [address] [place], [place], [facility] 08/07/2013 5:27 PM

## 2013-08-14 ENCOUNTER — Other Ambulatory Visit: Payer: Self-pay | Admitting: Physician Assistant

## 2013-08-14 NOTE — Telephone Encounter (Signed)
Medication refilled per protocol. 

## 2013-08-26 ENCOUNTER — Other Ambulatory Visit: Payer: Self-pay | Admitting: Physician Assistant

## 2013-08-27 NOTE — Telephone Encounter (Signed)
Medication refilled per protocol. 

## 2013-09-12 ENCOUNTER — Other Ambulatory Visit: Payer: Self-pay | Admitting: Family Medicine

## 2013-09-12 DIAGNOSIS — E039 Hypothyroidism, unspecified: Secondary | ICD-10-CM

## 2013-09-12 MED ORDER — LEVOTHYROXINE SODIUM 25 MCG PO TABS: 25.0000 ug | ORAL_TABLET | Freq: Every day | ORAL | Status: DC

## 2013-09-19 ENCOUNTER — Other Ambulatory Visit: Payer: Medicare Other

## 2013-09-19 DIAGNOSIS — E039 Hypothyroidism, unspecified: Secondary | ICD-10-CM

## 2013-09-19 LAB — TSH: TSH: 8.165 u[IU]/mL — ABNORMAL HIGH (ref 0.350–4.500)

## 2013-09-20 ENCOUNTER — Telehealth: Payer: Self-pay | Admitting: Family Medicine

## 2013-09-20 DIAGNOSIS — E039 Hypothyroidism, unspecified: Secondary | ICD-10-CM

## 2013-09-20 MED ORDER — LEVOTHYROXINE SODIUM 50 MCG PO TABS: 50.0000 ug | ORAL_TABLET | Freq: Every day | ORAL | Status: AC

## 2013-09-20 NOTE — Telephone Encounter (Signed)
Message copied by Donne AnonPLUMMER, Mckenzy Salazar M on Thu Sep 20, 2013  3:08 PM ------      Message from: Allayne ButcherIXON, MARY      Created: Thu Sep 20, 2013  8:39 AM       Patient has dementia and lives with her daughter. Speak to the daughter when you call. Tell her that we need to adjust the thyroid dose. Currently taking 25 mcg. Increase to 50 mcg. Recheck labs in 6 weeks. Place order for Synthroid 50 mcg 1 by mouth daily #30 with one refill.  place  future order TSH. ------

## 2013-09-20 NOTE — Telephone Encounter (Signed)
Daughter called back and made aware of lab results, med change, need repeat lab 6 wks

## 2013-09-25 ENCOUNTER — Ambulatory Visit (INDEPENDENT_AMBULATORY_CARE_PROVIDER_SITE_OTHER): Payer: Medicare Other | Admitting: Diagnostic Neuroimaging

## 2013-09-25 ENCOUNTER — Encounter: Payer: Self-pay | Admitting: Diagnostic Neuroimaging

## 2013-09-25 VITALS — BP 109/56 | HR 64 | Temp 97.1°F | Ht 60.0 in | Wt 121.0 lb

## 2013-09-25 DIAGNOSIS — F0390 Unspecified dementia without behavioral disturbance: Secondary | ICD-10-CM

## 2013-09-25 DIAGNOSIS — F03B0 Unspecified dementia, moderate, without behavioral disturbance, psychotic disturbance, mood disturbance, and anxiety: Secondary | ICD-10-CM

## 2013-09-25 MED ORDER — MEMANTINE HCL ER 28 MG PO CP24: 1.0000 | ORAL_CAPSULE | Freq: Every day | ORAL | Status: AC

## 2013-09-25 NOTE — Progress Notes (Signed)
[facility]  PATIENT: Tamara Chavez DOB: April 26, 1941  REFERRING CLINICIAN:  HISTORY FROM: patient, daughter REASON FOR VISIT: follow up   HISTORICAL  CHIEF COMPLAINT:  Chief Complaint  Patient presents with  . Follow-up    Dementia    HISTORY OF PRESENT ILLNESS:   UPDATE 09/25/13: Since last visit, memory continues to get worse. Now having diff with ADLS such as dressing. Her daughter is doing a great job with maintaining regular routine for waking up, meals, pills and activities. Patient [facility] 3 times per week. [employer] comes over 2 x per week and take her out for lunch and socializing.  PRIOR HPI (01/30/13): 73 year old Caucasian female with history of memory loss. She lived previously in [place], [place] with her husband, who passed away approximately 1 month ago. Son and daughter report they noticed memory loss as long as 5 years ago. They state their father did not share their mother's medical problems with them.  They have requested her records from previous Neurologist in [facility]. They state she was driving previously in [facility], but recently missed exit and forgot how to get home. Patient states she has lost interest in doing quilting, but she does stay active.  They state her mood is consistently good.  She is able to perform ADLs on her own.  Denies mood swings, change in appetite, incontinence.  Tolerating medication well.  REVIEW OF SYSTEMS: Full 14 system review of systems performed and notable only for light sensitivity coordination problem nervousness anxiety confusion agitation memory loss.  ALLERGIES: No Known Allergies  HOME MEDICATIONS: Outpatient Prescriptions Prior to Visit  Medication Sig Dispense Refill  . atenolol (TENORMIN) 25 MG tablet Take 1 tablet (25 mg total) by mouth every evening.  30 tablet  4  . donepezil (ARICEPT) 10 MG tablet TAKE 1 TABLET (10 MG TOTAL) BY MOUTH AT BEDTIME.  30 tablet  5  . folic acid  (FOLVITE) 400 MCG tablet Take 400 mcg by mouth daily.      Marland Kitchen levothyroxine (SYNTHROID, LEVOTHROID) 50 MCG tablet Take 1 tablet (50 mcg total) by mouth daily.  30 tablet  1  . LORazepam (ATIVAN) 0.5 MG tablet Take 1 tablet (0.5 mg total) by mouth 2 (two) times daily as needed for anxiety.  60 tablet  0  . vitamin B-12 (CYANOCOBALAMIN) 1000 MCG tablet Take 1,000 mcg by mouth daily.      . vitamin E (VITAMIN E) 400 UNIT capsule Take 400 Units by mouth daily.      Marland Kitchen NAMENDA 10 MG tablet TAKE 1 TABLET TWICE A DAY  60 tablet  5   No facility-administered medications prior to visit.    PAST MEDICAL HISTORY: Past Medical History  Diagnosis Date  . Anxiety   . Dementia   . Hypertension   . Hyperlipidemia   . Thyroid disease     hypothyroid    PAST SURGICAL HISTORY: Past Surgical History  Procedure Laterality Date  . Eye surgery      cataracts, eye lift    FAMILY HISTORY: Family History  Problem Relation Age of Onset  . Alcohol abuse Mother   . Alcohol abuse Father     SOCIAL HISTORY:  History   Social History  . Marital Status: Widowed    Spouse Name: N/A    Number of Children: 2  . Years of Education: HS   Occupational History  . Retired    Social History Main  Topics  . Smoking status: Former Smoker -- 0.50 packs/day for 6 years    Quit date: 08/07/1993  . Smokeless tobacco: Never Used  . Alcohol Use: No  . Drug Use: No  . Sexual Activity: Not on file   Other Topics Concern  . Not on file   Social History Narrative   Patient lives at home with daughter.    Caffeine Use: rarely     PHYSICAL EXAM  Filed Vitals:   09/25/13 1432  BP: 109/56  Pulse: 64  Temp: 97.1 F (36.2 C)  TempSrc: Oral  Height: 5' (1.524 m)  Weight: 121 lb (54.885 kg)    Not recorded    Body mass index is 23.63 kg/(m^2).  GENERAL EXAM: Patient is in no distress, well developed, well groomed. Pleasant.  CARDIOVASCULAR: Regular rate and rhythm, no murmurs, no carotid  bruits  NEUROLOGIC: MENTAL STATUS: awake, alert, DECREASED FLUENCY, comprehension intact, naming intact. MMSE 10/30, with deficits in orientation, recall, attention and calculation, writing and drawing.  AFT 5. CANNOT DRAW CLOCK OR PENTAGONS. MOTOR APRAXIA. PLEASANT AND POLITE. CRANIAL NERVE: pupils equal and reactive to light, visual fields full to confrontation, extraocular muscles intact, no nystagmus, facial sensation and strength symmetric, uvula midline, shoulder shrug symmetric, tongue midline. MOTOR: normal bulk and tone, full strength in the BUE, BLE SENSORY: normal and symmetric to light touch, temperature, vibration COORDINATION: finger-nose-finger, fine finger movements normal REFLEXES: deep tendon reflexes present and symmetric GAIT/STATION: narrow based gait   DIAGNOSTIC DATA (LABS, IMAGING, TESTING) - I reviewed patient records, labs, notes, testing and imaging myself where available.  Lab Results  Component Value Date   WBC 4.9 01/05/2013   HGB 14.1 01/05/2013   HCT 42.3 01/05/2013   MCV 93.4 01/05/2013   PLT 264 01/05/2013      Component Value Date/Time   NA 142 01/05/2013 1005   K 4.9 01/05/2013 1005   CL 102 01/05/2013 1005   CO2 31 01/05/2013 1005   GLUCOSE 105* 01/05/2013 1005   BUN 15 01/05/2013 1005   CREATININE 0.95 01/05/2013 1005   CALCIUM 10.0 01/05/2013 1005   PROT 7.5 01/05/2013 1005   ALBUMIN 4.3 01/05/2013 1005   AST 22 01/05/2013 1005   ALT 19 01/05/2013 1005   ALKPHOS 65 01/05/2013 1005   BILITOT 0.4 01/05/2013 1005   No results found for this basename: CHOL,  HDL,  LDLCALC,  LDLDIRECT,  TRIG,  CHOLHDL   No results found for this basename: HGBA1C   No results found for this basename: VITAMINB12   Lab Results  Component Value Date   TSH 8.165* 09/19/2013      ASSESSMENT AND PLAN  73 y.o. year old female  has a past medical history of Anxiety; Dementia; Hypertension; Hyperlipidemia; and Thyroid disease. here with memory loss, consistent with moderate dementia (likely  alzheimer's disease).  PLAN: 1. Continue donepezil and namenda 2. Agree with plan for skilled nursing facility in [place] (closer to other family members)  Return in about 6 months (around 03/25/2014).    Suanne MarkerVIKRAM R. PENUMALLI, MD 09/25/2013, 3:31 PM Certified in Neurology, Neurophysiology and Neuroimaging  [facility] [address], [address] [place], [place] [address] [phone]

## 2013-09-25 NOTE — Patient Instructions (Signed)
Follow up 6 months. Let us know if you need help with forms or transfer of records.

## 2014-03-25 ENCOUNTER — Ambulatory Visit: Payer: Medicare Other | Admitting: Diagnostic Neuroimaging

## 2015-04-11 IMAGING — CR DG SHOULDER 2+V*R*
2 series · 2 of 2 positions shown · non-contrast
Comparison: None.

CLINICAL DATA: Fall. Right shoulder pain.

EXAM:
RIGHT SHOULDER - 2+ VIEW

[t shoulder internal right]
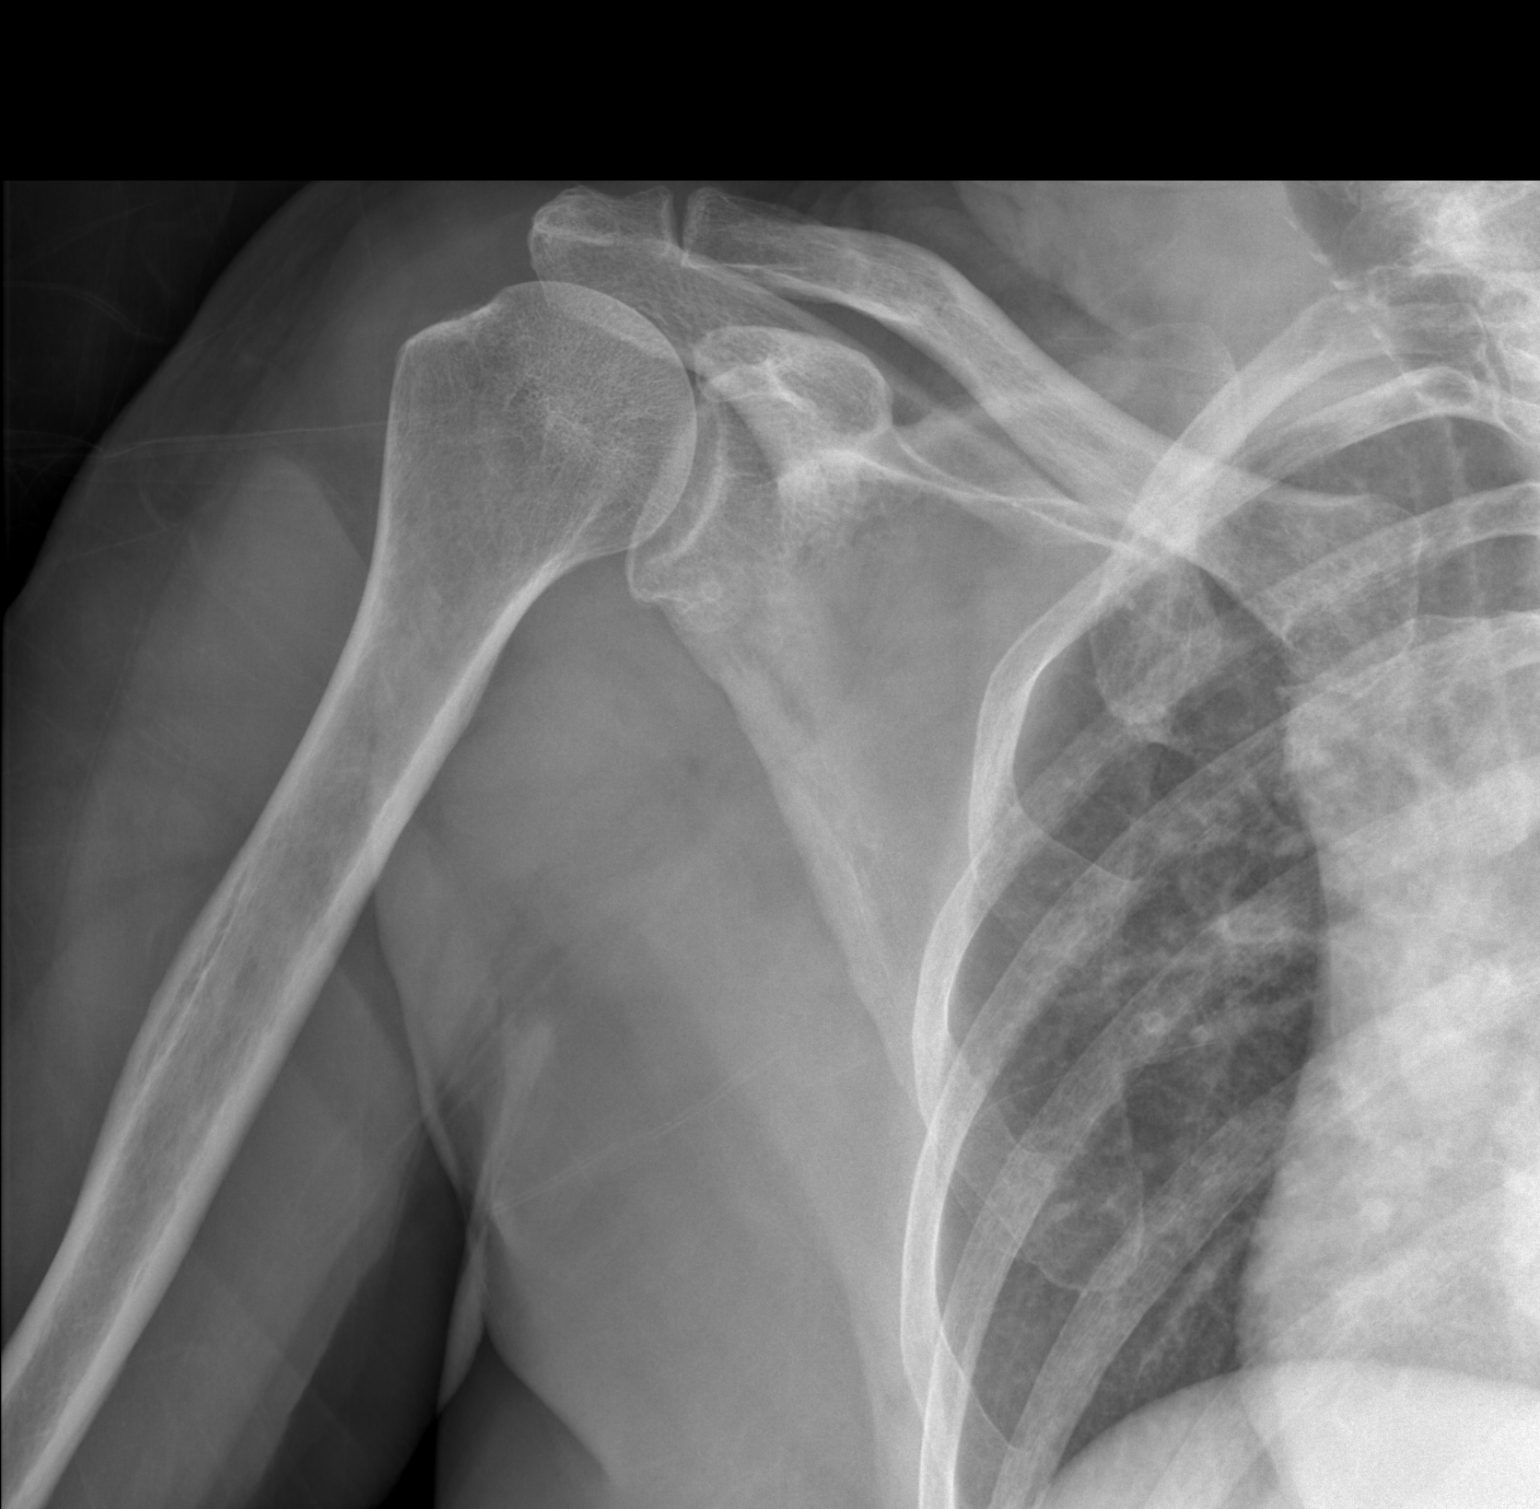

[t shoulder y-view right]
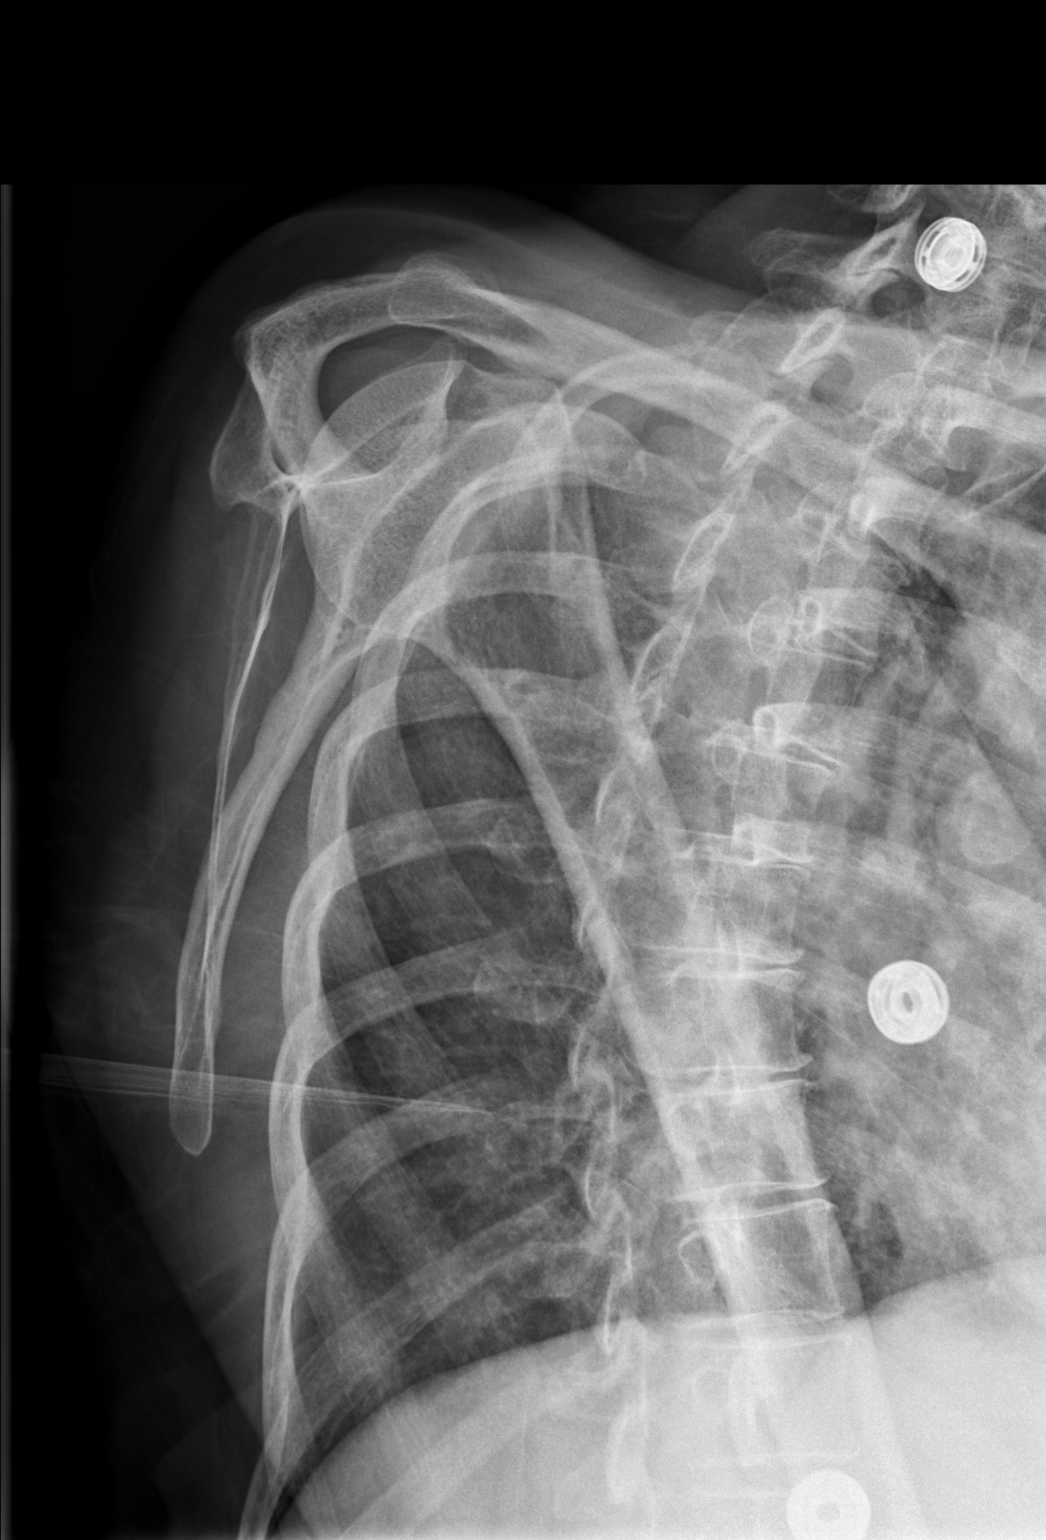

[2 of 2 positions shown; findings below may reference images not displayed]

FINDINGS: No acute bony abnormality. Specifically, no fracture, subluxation,
or dislocation. Soft tissues are intact.
IMPRESSION: No acute bony abnormality.

## 2015-04-11 IMAGING — CT CT HEAD W/O CM
3 of 5 series · 14 of 47 positions shown, 16 images · non-contrast
Comparison: None.

CLINICAL DATA: Cervical spondylosis and degenerative disc disease.

EXAM:
CT HEAD WITHOUT CONTRAST
CT CERVICAL SPINE WITHOUT CONTRAST
TECHNIQUE: Multidetector CT imaging of the head and cervical spine was
performed following the standard protocol without intravenous
contrast. Multiplanar CT image reconstructions of the cervical spine
were also generated.

[Series 7: coronals · coronal · 0.31mm/px · 3 of 40 slices shown]
[im 14/40  brain]
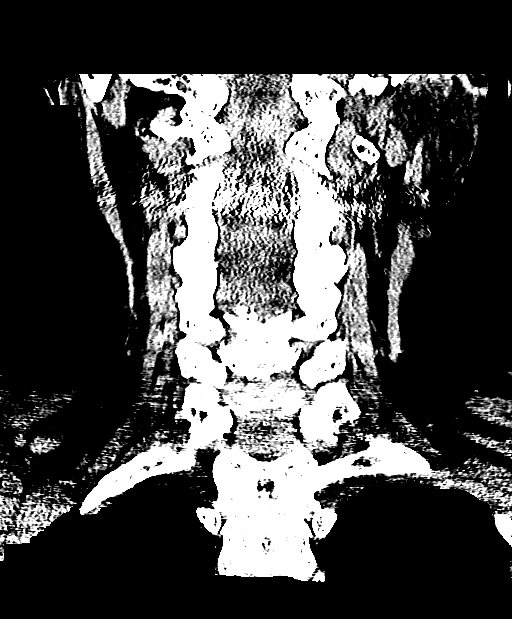
[im 18/40  brain]
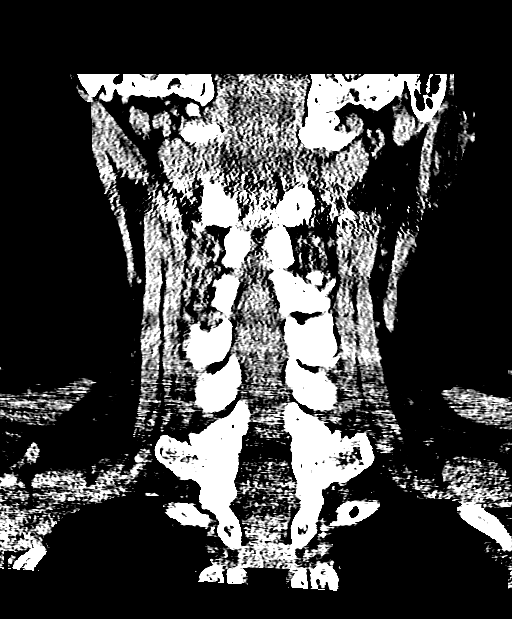
[im 22/40  brain]
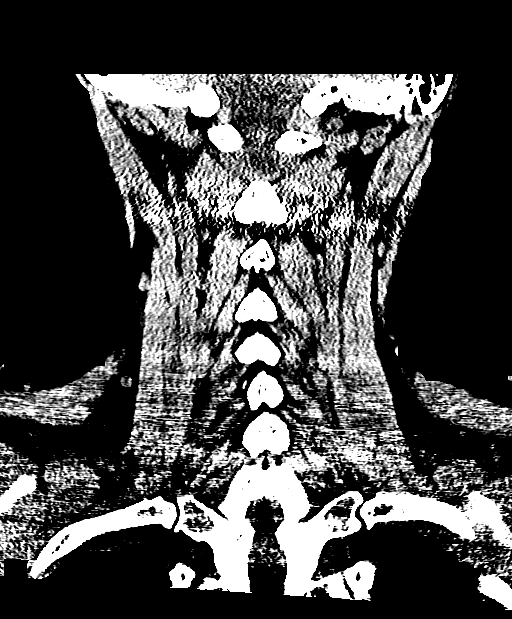

[Series 8: sagittals · sagittal · 0.34mm/px · 3 of 55 slices shown]
[im 19/55  brain]
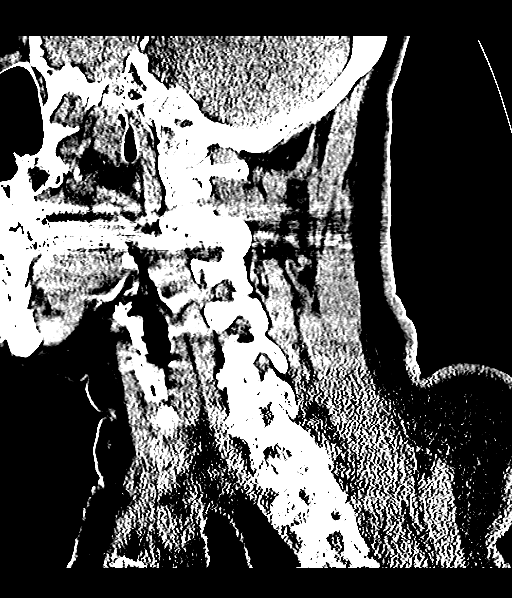
[im 28/55  brain]
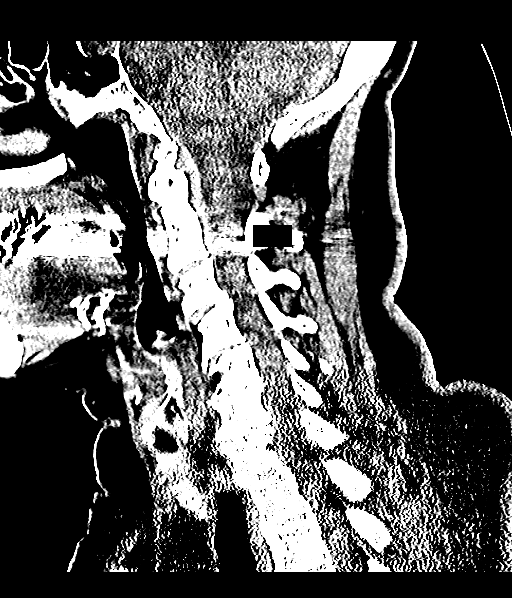
[im 37/55  brain]
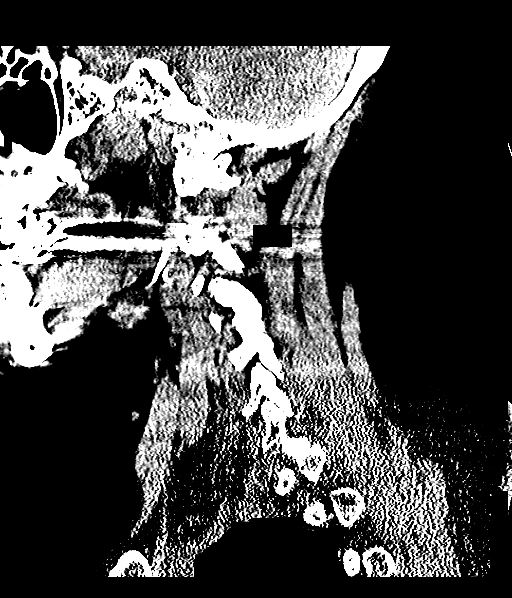

[Series 9: orthogonals · axial · 0.17mm/px · z∈[-324,-176]mm · 8 of 96 slices shown, 10 images]
[im 8/96  brain]
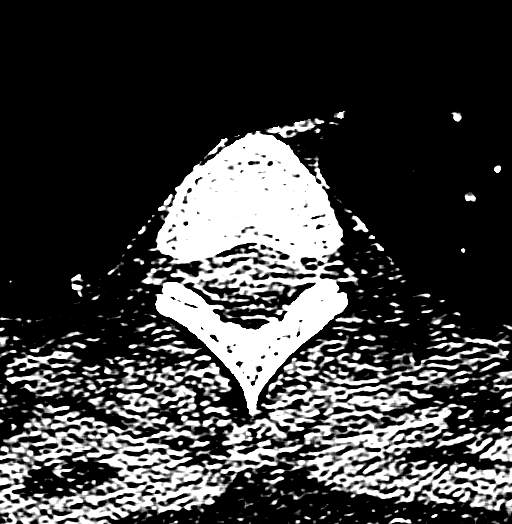
[im 8/96  bone]
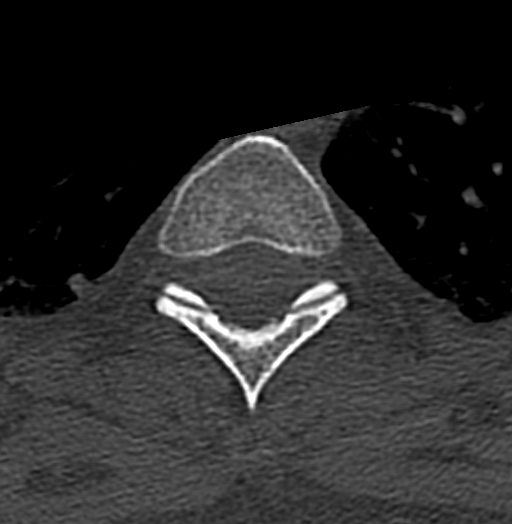
[im 24/96  brain]
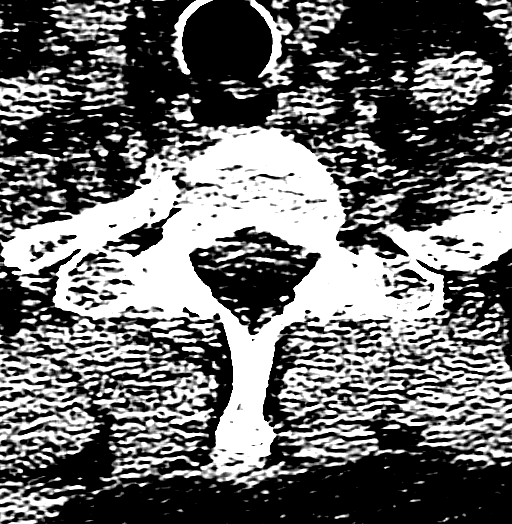
[im 32/96  brain]
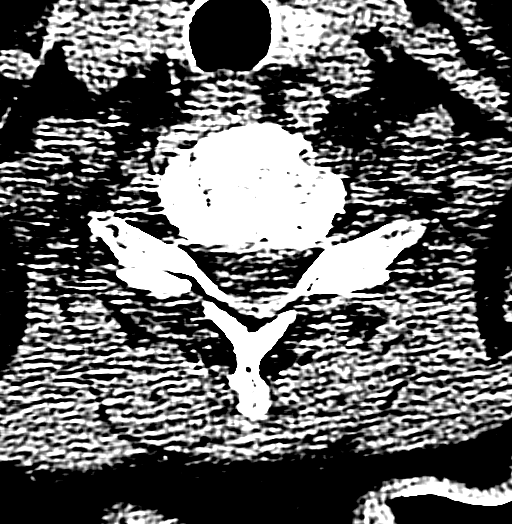
[im 40/96  brain]
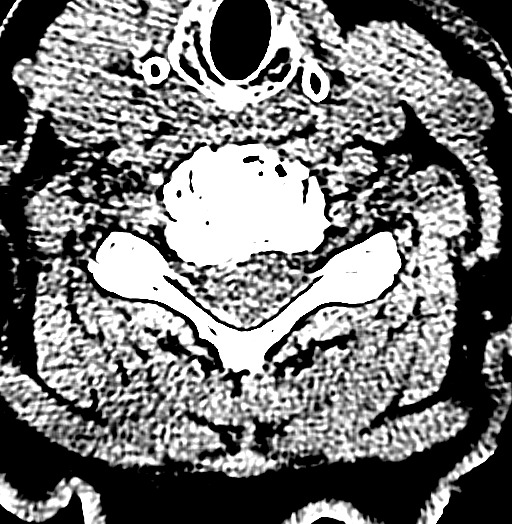
[im 56/96  brain]
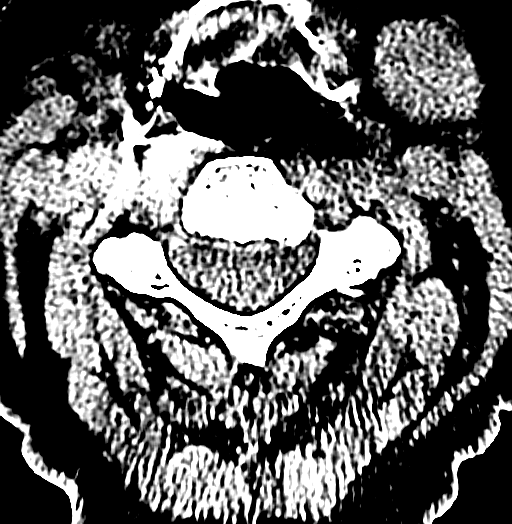
[im 56/96  bone]
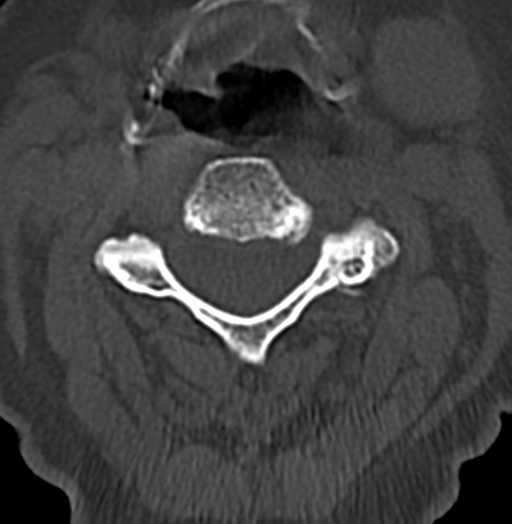
[im 64/96  brain]
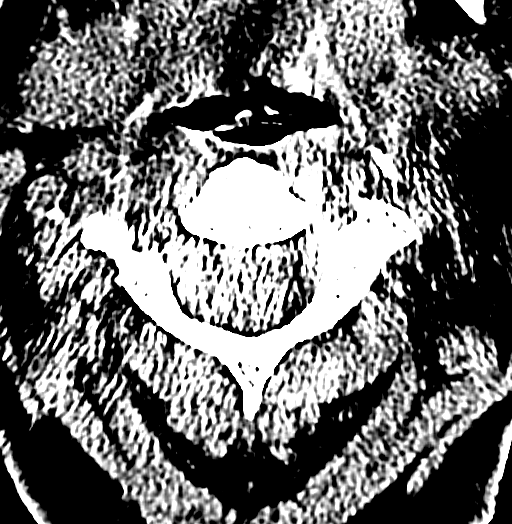
[im 72/96  brain]
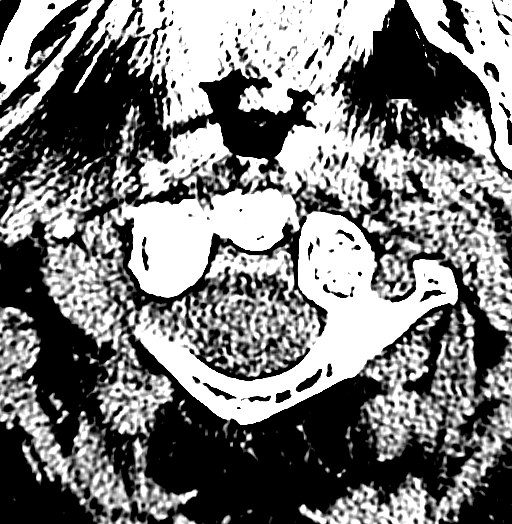
[im 88/96  brain]
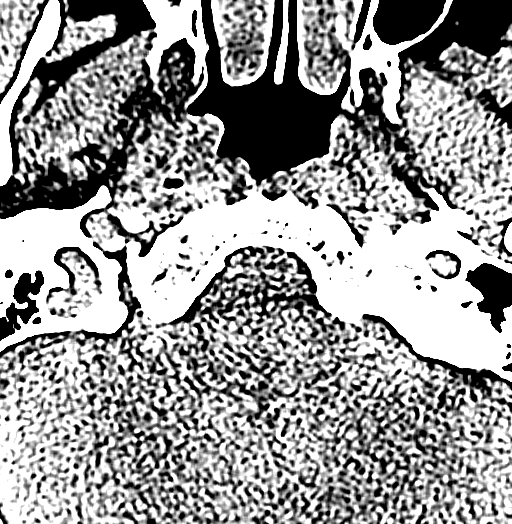

[14 of 47 positions shown; findings below may reference images not displayed]

FINDINGS: CT HEAD FINDINGS

Subtle thickening of the Urgacz compatible with small full seen
subdural hematoma. Tiny amount of subarachnoid hemorrhage medially
in the right frontal lobe on image 22 of series 2. No
intraventricular hemorrhage or other intracranial hemorrhage
observed. No mass lesion or acute CVA. No midline shift.

CT CERVICAL SPINE FINDINGS

Subtle cortical irregularity of the tip of the right lateral
transverse process and potentially of the left lateral transverse
process of C1 - suspicion is for nondisplaced fractures although the
findings are extremely subtle. Not thought to be an unstable
fracture.

No other regions of suspicion for cervical spine fracture.
Multilevel posterior osseous ridging and uncinate spurring leading
osseous foraminal stenosis on the left at C3-4 and C6-7, and on the
right at C5-6 and C6-7 degenerative grade 1 anterolisthesis at C7-T1
due to facet arthropathy. The posterior osseous ridging at C5-6 is
likely contributing to mild central stenosis.
IMPRESSION: 1. Small falcine subdural hematoma, with a trace amount of
subarachnoid hemorrhage medially along the right frontal lobe.
2. Subtle irregularities of the lateral tips of the transverse
processes of C1, potentially representing nondisplaced fractures
although very subtle. This is not thought to represent an unstable
fracture although is close to the vertebral arteries.
Critical Value/emergent results were called by telephone at the time
of interpretation on 07/16/2013 at [DATE] to Dr.[email or web address] KUREHA ,
who verbally acknowledged these results.

## 2015-11-05 DEATH — deceased
